# Patient Record
Sex: Female | Born: 1990 | Race: White | Hispanic: No | Marital: Single | Smoking: Never smoker
Health system: Southern US, Community
[De-identification: ages and names within clinical notes are randomized; demographics above are authoritative.]

## PROBLEM LIST (undated history)

## (undated) DIAGNOSIS — F32A Depression, unspecified: Secondary | ICD-10-CM

## (undated) DIAGNOSIS — F329 Major depressive disorder, single episode, unspecified: Secondary | ICD-10-CM

## (undated) DIAGNOSIS — F419 Anxiety disorder, unspecified: Secondary | ICD-10-CM

---
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
## 2015-02-04 HISTORY — PX: KNEE ARTHROSCOPY: SHX127

## 2017-06-09 ENCOUNTER — Emergency Department (HOSPITAL_COMMUNITY)

## 2017-06-09 ENCOUNTER — Other Ambulatory Visit: Payer: Self-pay

## 2017-06-09 ENCOUNTER — Observation Stay (HOSPITAL_COMMUNITY)
Admission: EM | Admit: 2017-06-09 | Discharge: 2017-06-10 | Disposition: A | Discharge status: Home / Self Care | Attending: General Surgery | Admitting: General Surgery

## 2017-06-09 ENCOUNTER — Encounter (HOSPITAL_COMMUNITY): Payer: Self-pay | Admitting: Emergency Medicine

## 2017-06-09 DIAGNOSIS — Z79899 Other long term (current) drug therapy: Secondary | ICD-10-CM | POA: Insufficient documentation

## 2017-06-09 DIAGNOSIS — R2689 Other abnormalities of gait and mobility: Secondary | ICD-10-CM | POA: Diagnosis not present

## 2017-06-09 DIAGNOSIS — S2220XA Unspecified fracture of sternum, initial encounter for closed fracture: Secondary | ICD-10-CM | POA: Diagnosis present

## 2017-06-09 DIAGNOSIS — E041 Nontoxic single thyroid nodule: Secondary | ICD-10-CM | POA: Insufficient documentation

## 2017-06-09 HISTORY — DX: Depression, unspecified: F32.A

## 2017-06-09 HISTORY — DX: Major depressive disorder, single episode, unspecified: F32.9

## 2017-06-09 HISTORY — DX: Anxiety disorder, unspecified: F41.9

## 2017-06-09 LAB — I-STAT CHEM 8, ED
BUN: 14 mg/dL (ref 6–20)
CALCIUM ION: 1.07 mmol/L — AB (ref 1.15–1.40)
CHLORIDE: 107 mmol/L (ref 101–111)
CREATININE: 0.9 mg/dL (ref 0.44–1.00)
Glucose, Bld: 108 mg/dL — ABNORMAL HIGH (ref 65–99)
HCT: 38 % (ref 36.0–46.0)
Hemoglobin: 12.9 g/dL (ref 12.0–15.0)
Potassium: 3.8 mmol/L (ref 3.5–5.1)
Sodium: 140 mmol/L (ref 135–145)
TCO2: 22 mmol/L (ref 22–32)

## 2017-06-09 LAB — CBC
HCT: 39.2 % (ref 36.0–46.0)
HEMOGLOBIN: 12.6 g/dL (ref 12.0–15.0)
MCH: 28.1 pg (ref 26.0–34.0)
MCHC: 32.1 g/dL (ref 30.0–36.0)
MCV: 87.3 fL (ref 78.0–100.0)
Platelets: 334 10*3/uL (ref 150–400)
RBC: 4.49 MIL/uL (ref 3.87–5.11)
RDW: 14 % (ref 11.5–15.5)
WBC: 11.2 10*3/uL — AB (ref 4.0–10.5)

## 2017-06-09 LAB — COMPREHENSIVE METABOLIC PANEL
ALK PHOS: 41 U/L (ref 38–126)
ALT: 18 U/L (ref 14–54)
ANION GAP: 9 (ref 5–15)
AST: 25 U/L (ref 15–41)
Albumin: 4 g/dL (ref 3.5–5.0)
BILIRUBIN TOTAL: 0.5 mg/dL (ref 0.3–1.2)
BUN: 14 mg/dL (ref 6–20)
CALCIUM: 9.1 mg/dL (ref 8.9–10.3)
CO2: 22 mmol/L (ref 22–32)
Chloride: 108 mmol/L (ref 101–111)
Creatinine, Ser: 1.05 mg/dL — ABNORMAL HIGH (ref 0.44–1.00)
GFR calc Af Amer: >60 mL/min (ref >60)
Glucose, Bld: 116 mg/dL — ABNORMAL HIGH (ref 65–99)
POTASSIUM: 3.8 mmol/L (ref 3.5–5.1)
Sodium: 139 mmol/L (ref 135–145)
TOTAL PROTEIN: 6.8 g/dL (ref 6.5–8.1)

## 2017-06-09 LAB — I-STAT TROPONIN, ED: TROPONIN I, POC: 0 ng/mL (ref 0.00–0.08)

## 2017-06-09 LAB — I-STAT CG4 LACTIC ACID, ED: LACTIC ACID, VENOUS: 2.4 mmol/L — AB (ref 0.5–1.9)

## 2017-06-09 LAB — PROTIME-INR
INR: 1.14
Prothrombin Time: 14.5 seconds (ref 11.4–15.2)

## 2017-06-09 LAB — SAMPLE TO BLOOD BANK

## 2017-06-09 LAB — I-STAT BETA HCG BLOOD, ED (MC, WL, AP ONLY): I-stat hCG, quantitative: <5 m[IU]/mL (ref <5)

## 2017-06-09 LAB — ETHANOL

## 2017-06-09 MED ORDER — ONDANSETRON HCL 4 MG/2ML IJ SOLN
INTRAMUSCULAR | Status: AC
  Filled 2017-06-09: qty 2

## 2017-06-09 MED ORDER — ONDANSETRON HCL 4 MG/2ML IJ SOLN
4.0000 mg | Freq: Once | INTRAMUSCULAR | Status: AC
  Administered 2017-06-09: 4 mg via INTRAVENOUS

## 2017-06-09 MED ORDER — IOHEXOL 300 MG/ML  SOLN
100.0000 mL | Freq: Once | INTRAMUSCULAR | Status: AC | PRN
  Administered 2017-06-09: 100 mL via INTRAVENOUS

## 2017-06-09 MED ORDER — MORPHINE SULFATE (PF) 4 MG/ML IV SOLN
4.0000 mg | Freq: Once | INTRAVENOUS | Status: AC
  Administered 2017-06-09: 4 mg via INTRAVENOUS
  Filled 2017-06-09: qty 1

## 2017-06-09 MED ORDER — FENTANYL CITRATE (PF) 100 MCG/2ML IJ SOLN
25.0000 ug | Freq: Once | INTRAMUSCULAR | Status: AC
Start: 2017-06-09 — End: 2017-06-09
  Administered 2017-06-09: 25 ug via INTRAVENOUS

## 2017-06-09 MED ORDER — FENTANYL CITRATE (PF) 100 MCG/2ML IJ SOLN
INTRAMUSCULAR | Status: AC
Start: 2017-06-09 — End: 2017-06-10
  Filled 2017-06-09: qty 2
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
## 2017-06-10 ENCOUNTER — Encounter (HOSPITAL_COMMUNITY): Payer: Self-pay | Admitting: General Practice

## 2017-06-10 ENCOUNTER — Other Ambulatory Visit: Payer: Self-pay

## 2017-06-10 DIAGNOSIS — S2220XA Unspecified fracture of sternum, initial encounter for closed fracture: Secondary | ICD-10-CM | POA: Diagnosis present

## 2017-06-10 LAB — CBC
HEMATOCRIT: 36.3 % (ref 36.0–46.0)
Hemoglobin: 11.5 g/dL — ABNORMAL LOW (ref 12.0–15.0)
MCH: 27.5 pg (ref 26.0–34.0)
MCHC: 31.7 g/dL (ref 30.0–36.0)
MCV: 86.8 fL (ref 78.0–100.0)
PLATELETS: 281 10*3/uL (ref 150–400)
RBC: 4.18 MIL/uL (ref 3.87–5.11)
RDW: 13.7 % (ref 11.5–15.5)
WBC: 11.7 10*3/uL — AB (ref 4.0–10.5)

## 2017-06-10 LAB — CREATININE, SERUM
Creatinine, Ser: 0.83 mg/dL (ref 0.44–1.00)
GFR calc Af Amer: >60 mL/min (ref >60)
GFR calc non Af Amer: >60 mL/min (ref >60)

## 2017-06-10 LAB — HIV ANTIBODY (ROUTINE TESTING W REFLEX): HIV Screen 4th Generation wRfx: NONREACTIVE

## 2017-06-10 MED ORDER — SODIUM CHLORIDE 0.9 % IV SOLN
INTRAVENOUS | Status: DC
  Administered 2017-06-10: 100 mL via INTRAVENOUS
  Administered 2017-06-10: 02:00:00 via INTRAVENOUS

## 2017-06-10 MED ORDER — ONDANSETRON 4 MG PO TBDP
4.0000 mg | ORAL_TABLET | Freq: Four times a day (QID) | ORAL | Status: DC | PRN
  Administered 2017-06-10: 4 mg via ORAL
  Filled 2017-06-10 (×3): qty 1

## 2017-06-10 MED ORDER — ONDANSETRON HCL 4 MG/2ML IJ SOLN: 4.0000 mg | Freq: Four times a day (QID) | INTRAMUSCULAR | Status: DC | PRN

## 2017-06-10 MED ORDER — SERTRALINE HCL 25 MG PO TABS
25.0000 mg | ORAL_TABLET | Freq: Every day | ORAL | Status: DC
  Administered 2017-06-10: 25 mg via ORAL
  Filled 2017-06-10: qty 1

## 2017-06-10 MED ORDER — OXYCODONE HCL 5 MG PO TABS
5.0000 mg | ORAL_TABLET | ORAL | Status: DC | PRN
  Administered 2017-06-10: 5 mg via ORAL
  Filled 2017-06-10: qty 1

## 2017-06-10 MED ORDER — HYDROMORPHONE HCL 2 MG/ML IJ SOLN
1.0000 mg | INTRAMUSCULAR | Status: DC | PRN
  Administered 2017-06-10 (×5): 1 mg via INTRAVENOUS
  Filled 2017-06-10 (×5): qty 1

## 2017-06-10 MED ORDER — HYDROMORPHONE HCL 2 MG/ML IJ SOLN
0.5000 mg | INTRAMUSCULAR | Status: DC | PRN
  Administered 2017-06-10: 0.5 mg via INTRAVENOUS
  Filled 2017-06-10: qty 1

## 2017-06-10 MED ORDER — OXYCODONE HCL 5 MG PO TABS: 5.0000 mg | ORAL_TABLET | Freq: Four times a day (QID) | ORAL | 0 refills | Status: AC | PRN

## 2017-06-10 MED ORDER — ENOXAPARIN SODIUM 40 MG/0.4ML ~~LOC~~ SOLN: 40.0000 mg | SUBCUTANEOUS | Status: DC

## 2017-06-10 MED ORDER — ACETAMINOPHEN 325 MG PO TABS
650.0000 mg | ORAL_TABLET | Freq: Four times a day (QID) | ORAL | Status: DC
  Administered 2017-06-10: 650 mg via ORAL
  Filled 2017-06-10: qty 2
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
## 2017-06-10 NOTE — Progress Notes (Signed)
Discharge instructions, RX's and follow up appts explain and provided to patient and mother verbalized understanding. Patient left floor via wheelchair accompanied by staff.  Douglas Smolinsky, Kae Heller, RN
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
## 2017-06-10 NOTE — Plan of Care (Signed)

## 2017-06-22 ENCOUNTER — Ambulatory Visit
Admission: RE | Admit: 2017-06-22 | Discharge: 2017-06-22 | Disposition: A | Discharge status: Home / Self Care | Source: Ambulatory Visit | Attending: General Surgery | Admitting: General Surgery

## 2017-06-22 ENCOUNTER — Other Ambulatory Visit: Payer: Self-pay | Admitting: General Surgery

## 2017-06-22 DIAGNOSIS — S2220XD Unspecified fracture of sternum, subsequent encounter for fracture with routine healing: Secondary | ICD-10-CM
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
## 2019-02-22 IMAGING — DX DG CHEST 2V
2 series · 2 of 2 positions shown · non-contrast
Comparison: CT chest and chest radiograph 06/09/2017.

CLINICAL DATA: Sternal fracture. Continued pain. Subsequent
encounter.

EXAM:
CHEST - 2 VIEW

[dg chest 2 view (1 of 2)]
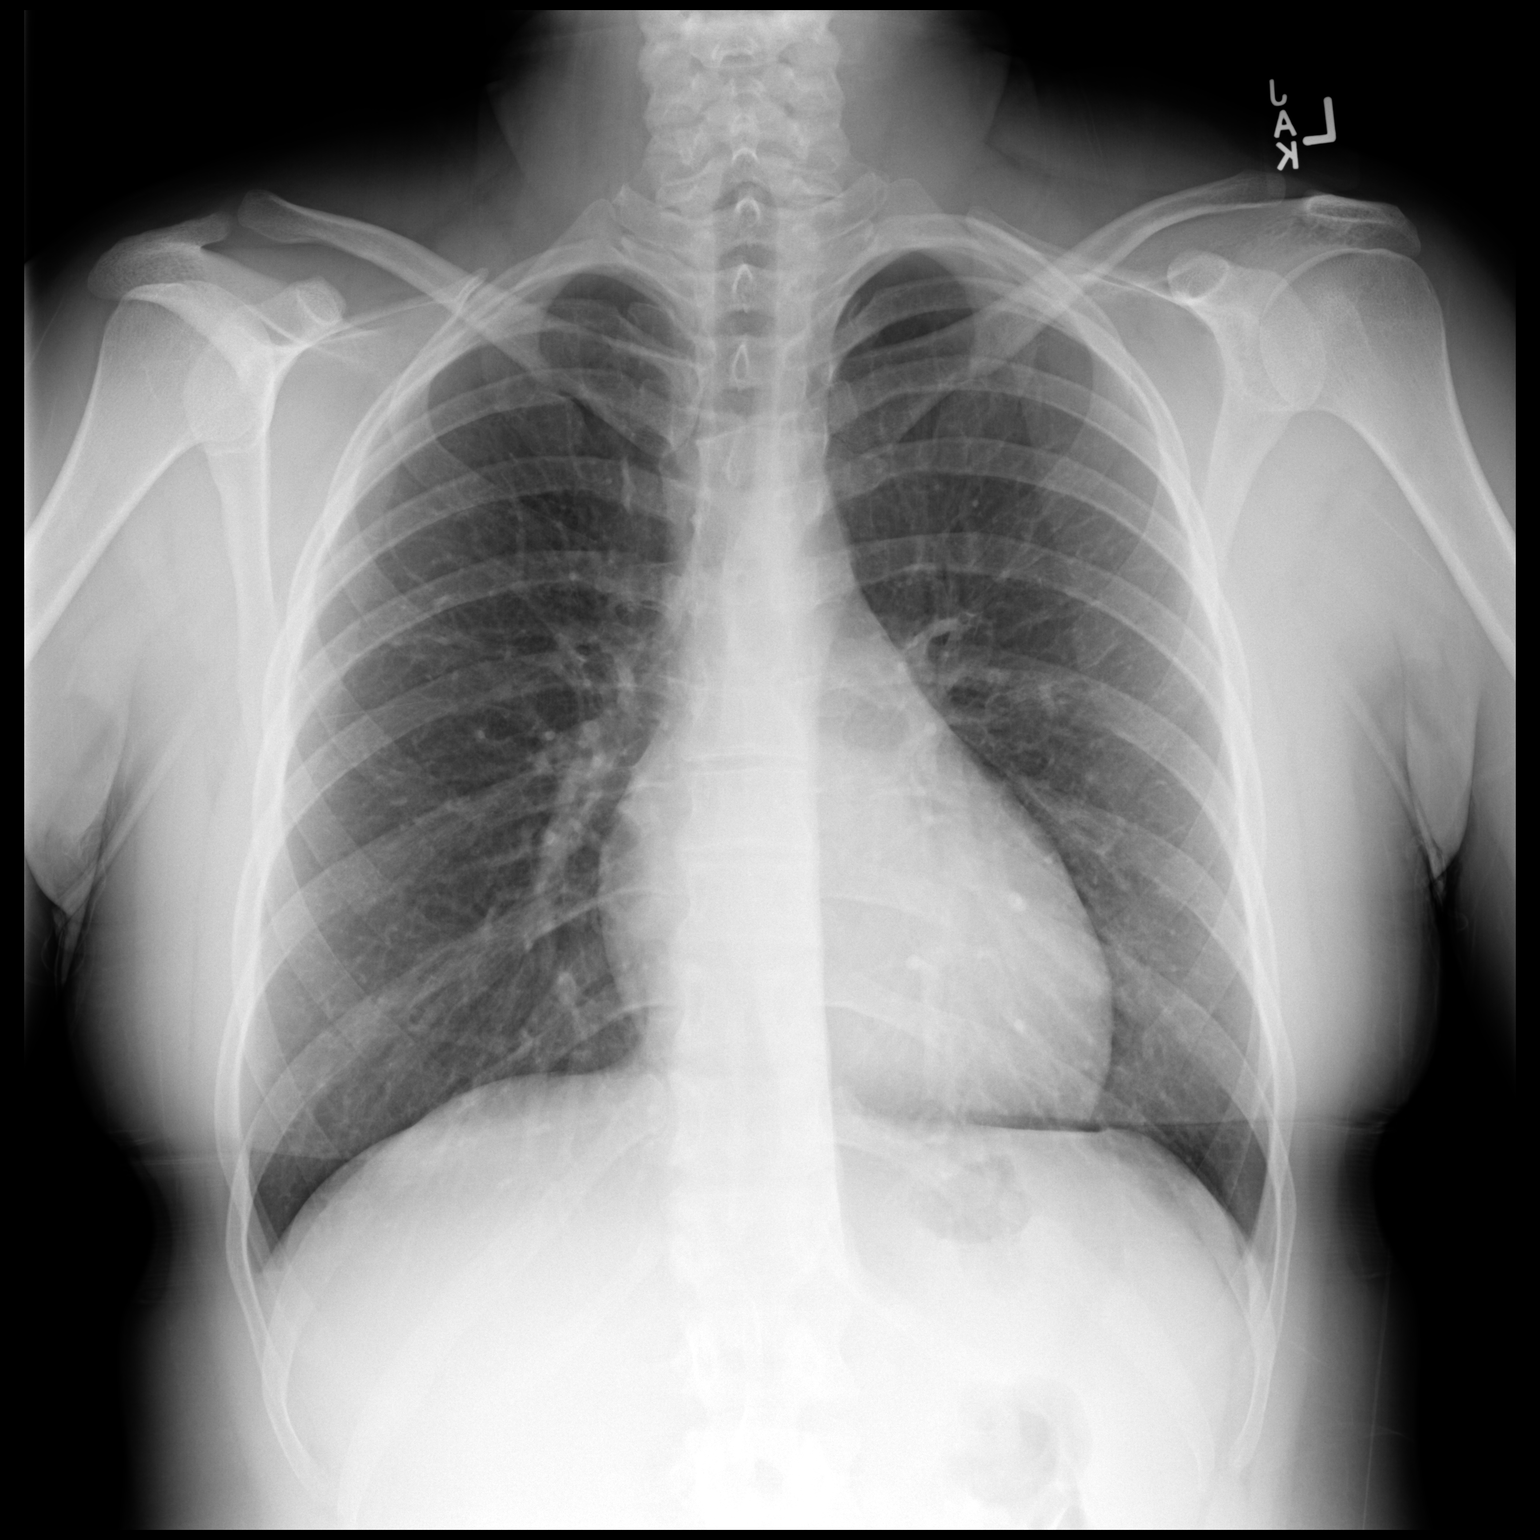

[dg chest 2 view (2 of 2)]
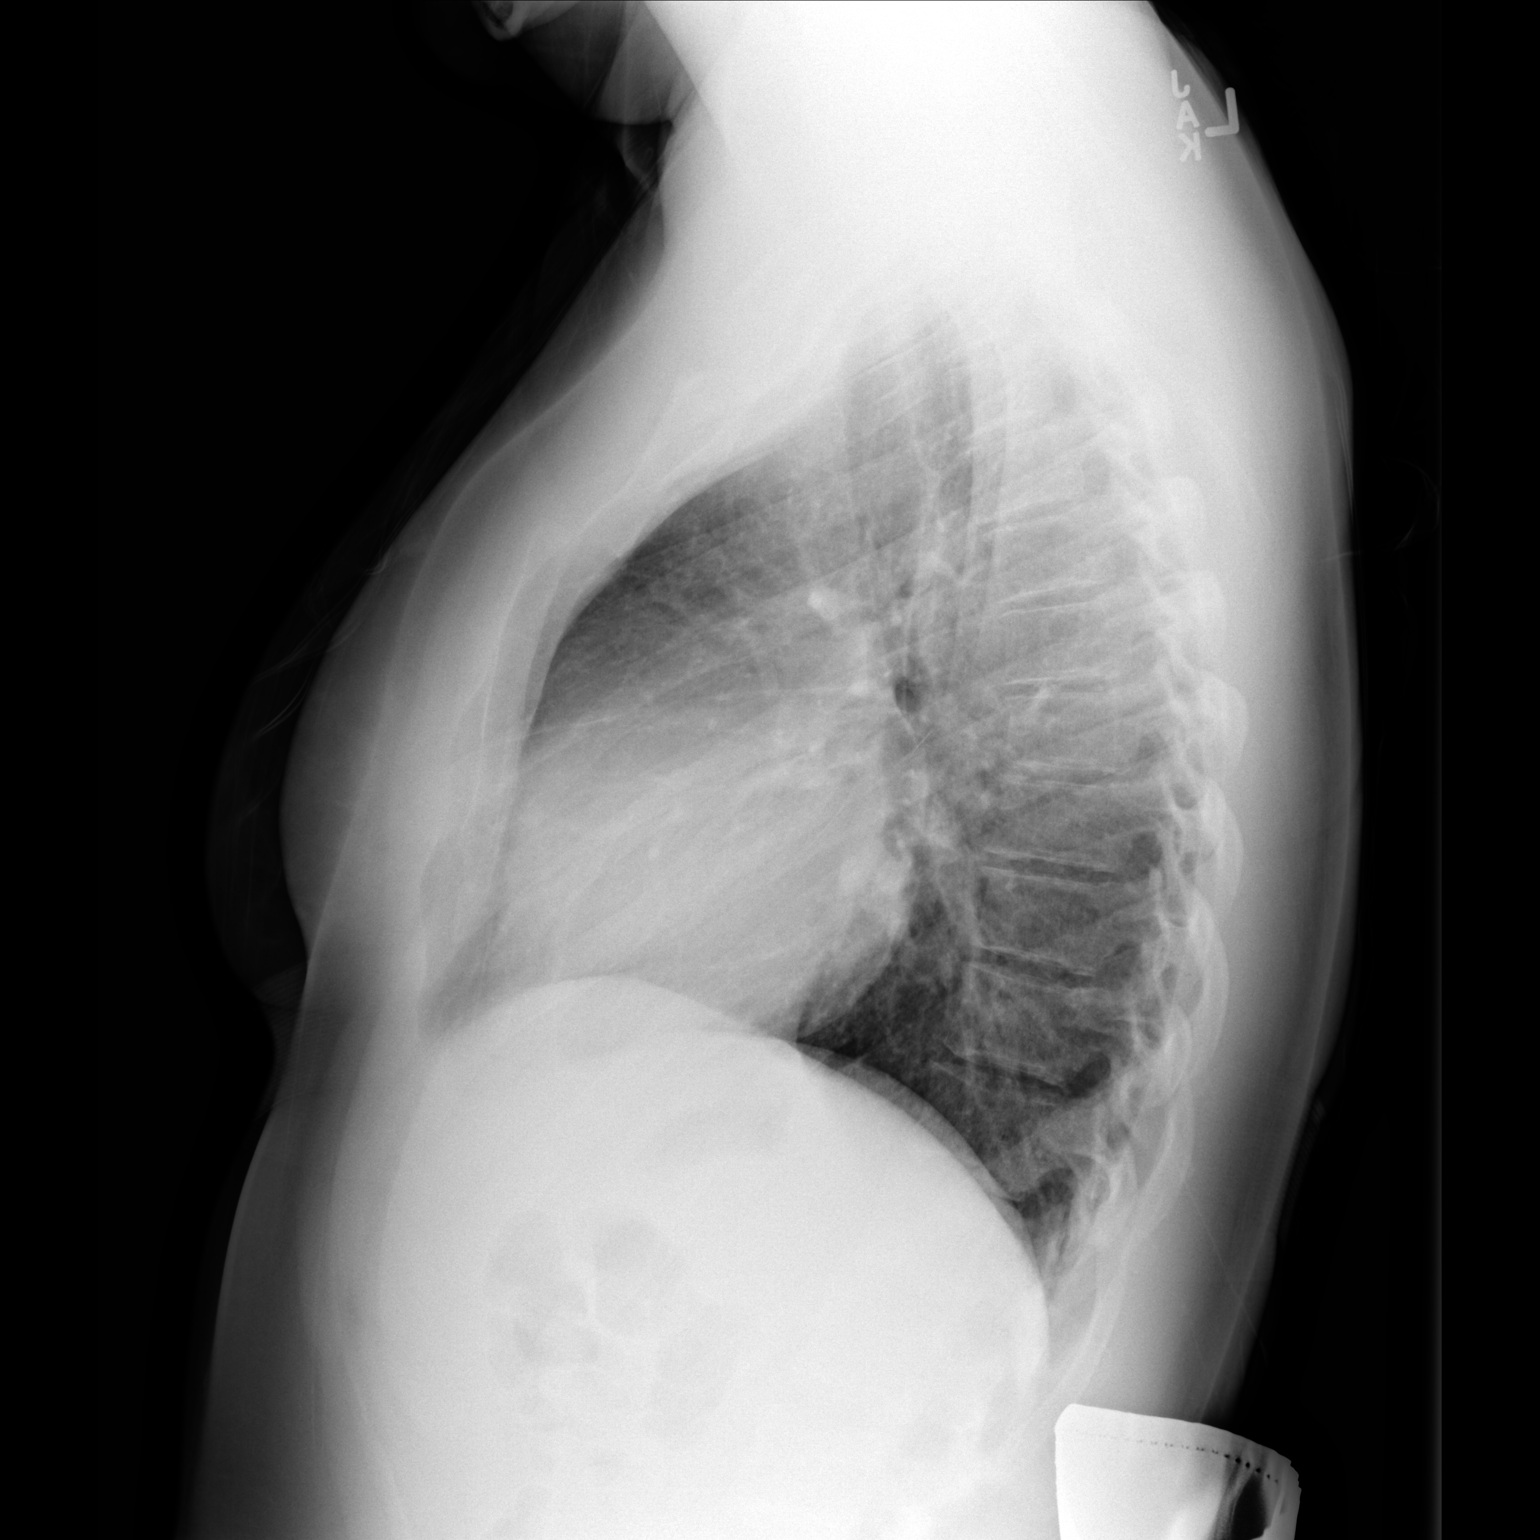

[2 of 2 positions shown; findings below may reference images not displayed]

FINDINGS: Trachea is midline. Heart size normal. Lungs are clear. No pleural
fluid.

Sternomanubrial joint dislocation with 1 shaft width anterior
displacement of the sternum, as before.
IMPRESSION: 1. Sternomanubrial joint dislocation, unchanged.
2. Otherwise, no acute findings in the chest.
